# Patient Record
Sex: Male | Born: 1990 | Hispanic: No | Smoking: Former smoker
Health system: Southern US, Community
[De-identification: ages and names within clinical notes are randomized; demographics above are authoritative.]

## PROBLEM LIST (undated history)

## (undated) DIAGNOSIS — D6803 Von willebrand disease, type 3: Secondary | ICD-10-CM

## (undated) DIAGNOSIS — D680 Von Willebrand's disease: Secondary | ICD-10-CM

---

## 2020-08-31 ENCOUNTER — Emergency Department (HOSPITAL_BASED_OUTPATIENT_CLINIC_OR_DEPARTMENT_OTHER)
Admission: EM | Admit: 2020-08-31 | Discharge: 2020-08-31 | Disposition: A | Discharge status: Home / Self Care | Payer: Self-pay | Attending: Emergency Medicine | Admitting: Emergency Medicine

## 2020-08-31 ENCOUNTER — Encounter (HOSPITAL_COMMUNITY): Payer: Self-pay

## 2020-08-31 ENCOUNTER — Emergency Department (HOSPITAL_BASED_OUTPATIENT_CLINIC_OR_DEPARTMENT_OTHER): Payer: Self-pay

## 2020-08-31 ENCOUNTER — Emergency Department (HOSPITAL_COMMUNITY)
Admission: EM | Admit: 2020-08-31 | Discharge: 2020-08-31 | Disposition: A | Discharge status: Home / Self Care | Payer: Self-pay | Attending: Emergency Medicine | Admitting: Emergency Medicine

## 2020-08-31 ENCOUNTER — Encounter (HOSPITAL_BASED_OUTPATIENT_CLINIC_OR_DEPARTMENT_OTHER): Payer: Self-pay | Admitting: Emergency Medicine

## 2020-08-31 ENCOUNTER — Other Ambulatory Visit: Payer: Self-pay

## 2020-08-31 DIAGNOSIS — H9201 Otalgia, right ear: Secondary | ICD-10-CM | POA: Insufficient documentation

## 2020-08-31 DIAGNOSIS — S0921XA Traumatic rupture of right ear drum, initial encounter: Secondary | ICD-10-CM | POA: Insufficient documentation

## 2020-08-31 DIAGNOSIS — X58XXXA Exposure to other specified factors, initial encounter: Secondary | ICD-10-CM | POA: Insufficient documentation

## 2020-08-31 DIAGNOSIS — Z5321 Procedure and treatment not carried out due to patient leaving prior to being seen by health care provider: Secondary | ICD-10-CM | POA: Insufficient documentation

## 2020-08-31 HISTORY — DX: Von Willebrand's disease: D68.0

## 2020-08-31 HISTORY — DX: Von willebrand disease, type 3: D68.03

## 2020-08-31 MED ORDER — HYDROCODONE-ACETAMINOPHEN 5-325 MG PO TABS: 1.0000 | ORAL_TABLET | ORAL | 0 refills | Status: AC | PRN

## 2020-08-31 MED ORDER — AMOXICILLIN 875 MG PO TABS: 875.0000 mg | ORAL_TABLET | Freq: Two times a day (BID) | ORAL | 0 refills | Status: AC

## 2020-08-31 MED ORDER — AMOXICILLIN 500 MG PO CAPS
1000.0000 mg | ORAL_CAPSULE | Freq: Once | ORAL | Status: AC
  Administered 2020-08-31: 1000 mg via ORAL
  Filled 2020-08-31: qty 2

## 2020-08-31 MED ORDER — HYDROCODONE-ACETAMINOPHEN 5-325 MG PO TABS
2.0000 | ORAL_TABLET | Freq: Once | ORAL | Status: AC
  Administered 2020-08-31: 2 via ORAL
  Filled 2020-08-31: qty 2

## 2020-08-31 NOTE — ED Provider Notes (Signed)
[facility] Provider Note: Lowella Dell, MD, FACEP  CSN: [id number] MRN: [id number] ARRIVAL: 08/31/20 at 0424 ROOM: [facility]01/[facility]01   CHIEF COMPLAINT  Ear Pain   HISTORY OF PRESENT ILLNESS  08/31/20 4:35 AM Erik Khan is a 29 y.o. male who stuck a Q-tip into his right ear yesterday.  He did not realize that the cotton swab had fallen off the end.  He had the gradual onset of pain in the right ear which is now severe (8 out of 10) and hearing is decreased in that ear.  He has had no bleeding or drainage from that ear.  Nothing makes the pain better or worse.  He has von Willebrand's disease and is concerned he may have caused bleeding behind the eardrum.   Past Medical History:  Diagnosis Date  . Von Willebrand disease, type III (HCC)     History reviewed. No pertinent surgical history.  No family history on file.  Social History   Tobacco Use  . Smoking status: Never Smoker  . Smokeless tobacco: Never Used  Substance Use Topics  . Alcohol use: Never  . Drug use: Never    Prior to Admission medications   Medication Sig Start Date End Date Taking? Authorizing Provider  amoxicillin (AMOXIL) 875 MG tablet Take 1 tablet (875 mg total) by mouth 2 (two) times daily. 08/31/20   Elgie Maziarz, MD  HYDROcodone-acetaminophen (NORCO) 5-325 MG tablet Take 1 tablet by mouth every 4 (four) hours as needed for severe pain. 08/31/20   Jaiven Graveline, MD    Allergies Aspirin   REVIEW OF SYSTEMS  Negative except as noted here or in the History of Present Illness.   PHYSICAL EXAMINATION  Initial Vital Signs Blood pressure (!) 158/93, pulse (!) 55, temperature 98.5 F (36.9 C), temperature source Oral, resp. rate 16, height 5\' 6"  (1.676 m), weight 86.2 kg, SpO2 100 %.  Examination General: Well-developed, well-nourished male in no acute distress; appearance consistent with age of record HENT: normocephalic; left TM normal; right TM with small puncture and blood behind  drum Eyes: pupils equal, round and reactive to light; extraocular muscles intact Neck: supple Heart: regular rate and rhythm Lungs: clear to auscultation bilaterally Abdomen: soft; nondistended; nontender; bowel sounds present Extremities: No deformity; full range of motion Neurologic: Awake, alert and oriented; motor function intact in all extremities and symmetric; no facial droop Skin: Warm and dry Psychiatric: Normal mood and affect   RESULTS  Summary of this visit's results, reviewed and interpreted by myself:   EKG Interpretation  Date/Time:    Ventricular Rate:    PR Interval:    QRS Duration:   QT Interval:    QTC Calculation:   R Axis:     Text Interpretation:        Laboratory Studies: No results found for this or any previous visit (from the past 24 hour(s)). Imaging Studies: No results found.  ED COURSE and MDM  Nursing notes, initial and subsequent vitals signs, including pulse oximetry, reviewed and interpreted by myself.  Vitals:   08/31/20 0432 08/31/20 0434 08/31/20 0620  BP:  (!) 158/93 126/88  Pulse:  (!) 55 60  Resp:  16 16  Temp:  98.5 F (36.9 C)   TempSrc:  Oral   SpO2:  100% 99%  Weight: 86.2 kg    Height: 5\' 6"  (1.676 m)     Medications  HYDROcodone-acetaminophen (NORCO/VICODIN) 5-325 MG per tablet 2 tablet (2 tablets Oral Given 08/31/20 0508)  amoxicillin (AMOXIL) capsule 1,000 mg (1,000 mg Oral Given 08/31/20 0618)   6:25 AM Although the CT report did not pull into this note it does show opacification in the right mastoid and ear which could represent hemorrhage.  We will place him on an antibiotic for prophylaxis against otitis media as well as analgesic for his pain.  He intends to self administer his von Willebrand factor when he gets home now that we have evidence concerning for bleeding.   PROCEDURES  Procedures   ED DIAGNOSES     ICD-10-CM   1. Traumatic perforation of tympanic membrane, right, initial encounter  S09.21XA          Bonham Zingale, Jonny Ruiz, MD 08/31/20 [phone]

## 2020-08-31 NOTE — ED Triage Notes (Signed)
Pt states that he was using a qtip yesterday and it didn't have the tip on it, now having pain on the R side.  

## 2020-08-31 NOTE — ED Triage Notes (Signed)
Pt states that he was using a qtip yesterday and it didn't have the tip on it, now having pain on the R side.

## 2021-04-12 IMAGING — CT CT TEMPORAL BONES W/O CM
2 of 6 series · 13 of 40 positions shown, 16 images · non-contrast
Comparison: None.

CLINICAL DATA: Right-sided pain after using a Q-tip yesterday

EXAM:
CT TEMPORAL BONES WITHOUT CONTRAST
TECHNIQUE: Axial and coronal plane CT imaging of the petrous temporal bones was
performed with thin-collimation image reconstruction. No intravenous
contrast was administered. Multiplanar CT image reconstructions were
also generated.

[Series 4: coronal bone · coronal · 0.16mm/px · 2 of 179 slices shown]
[im 60/179  bone]
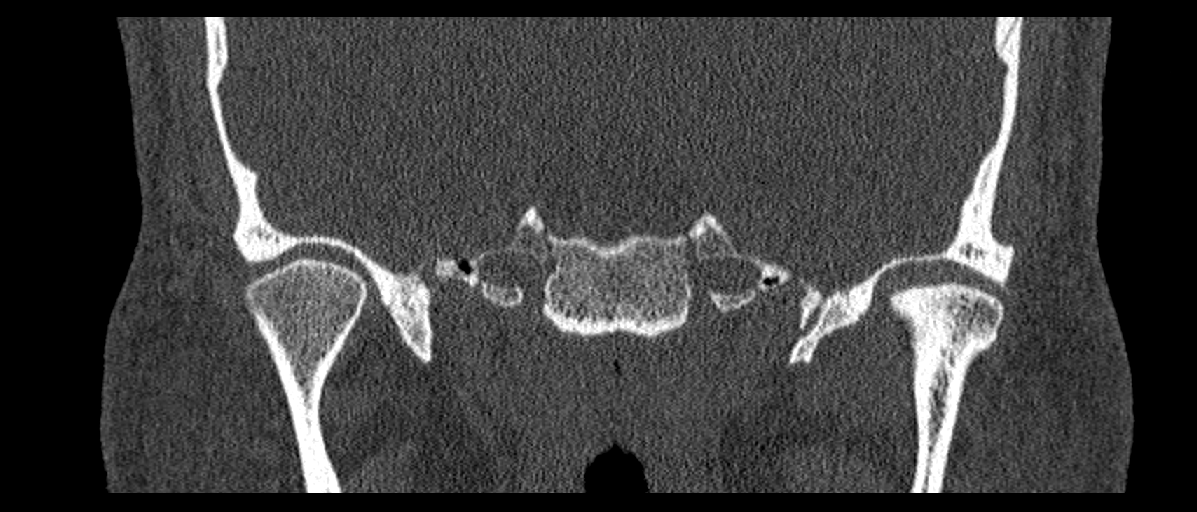
[im 119/179  bone]
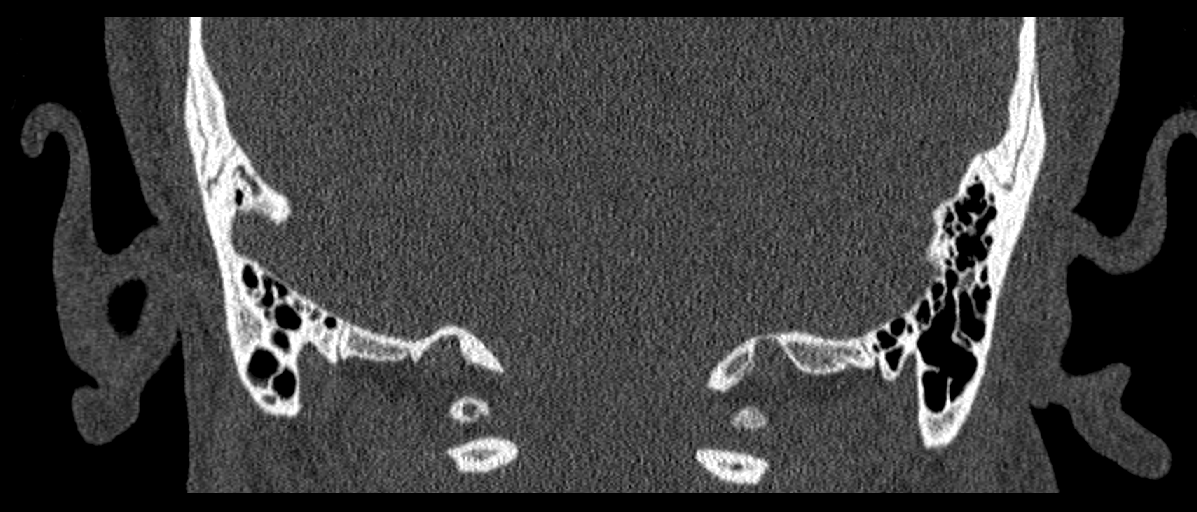

[Series 5: ax mag right · axial · 0.20mm/px · z∈[-107,-52]mm · 11 of 113 slices shown, 14 images]
[im 10/113  brain]
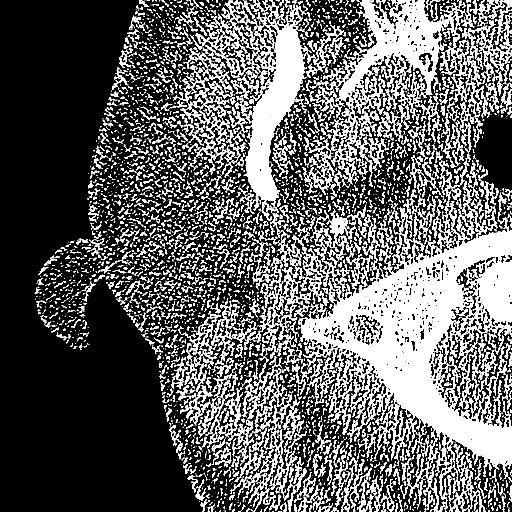
[im 10/113  bone]
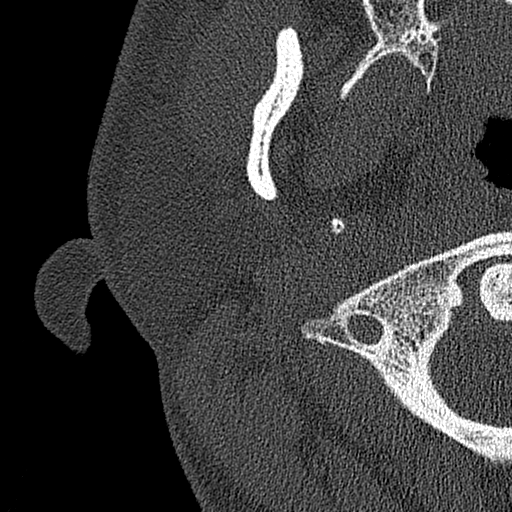
[im 19/113  bone]
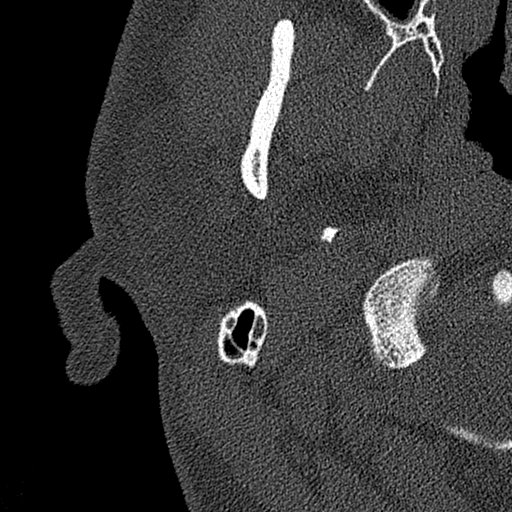
[im 29/113  bone]
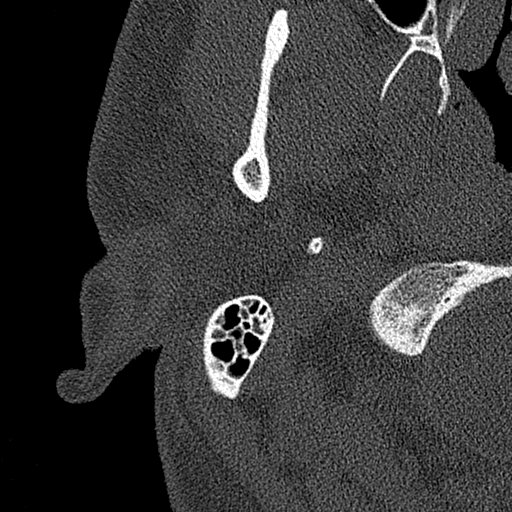
[im 38/113  bone]
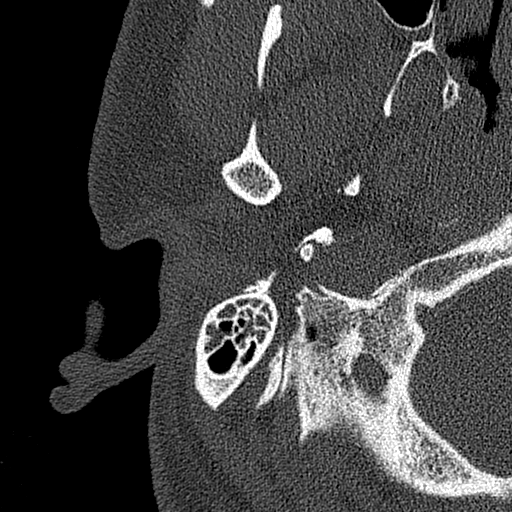
[im 47/113  brain]
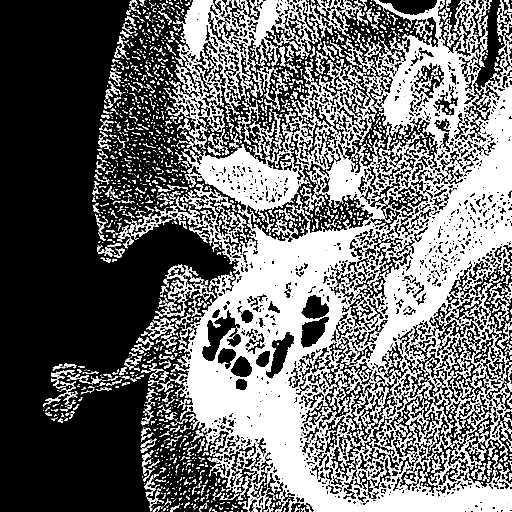
[im 47/113  bone]
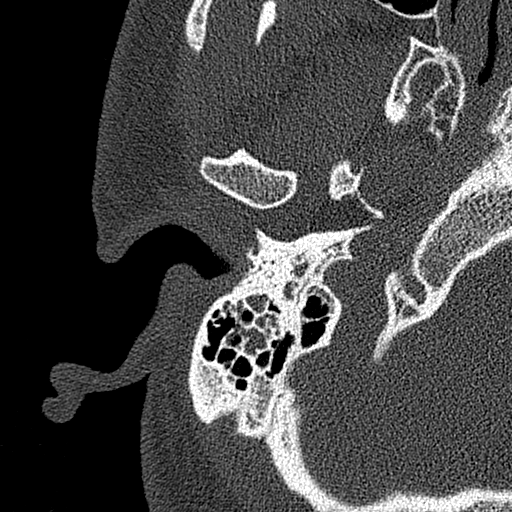
[im 57/113  bone]
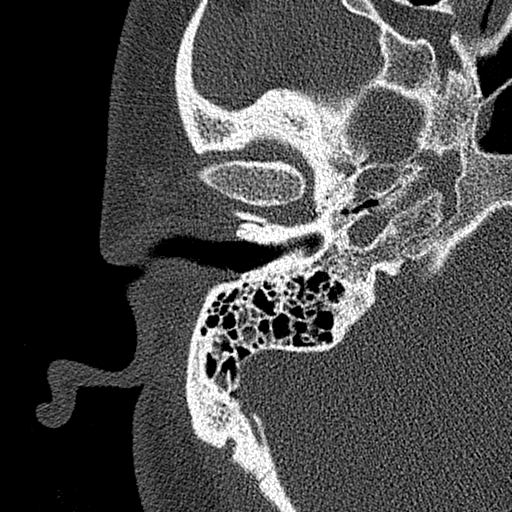
[im 66/113  bone]
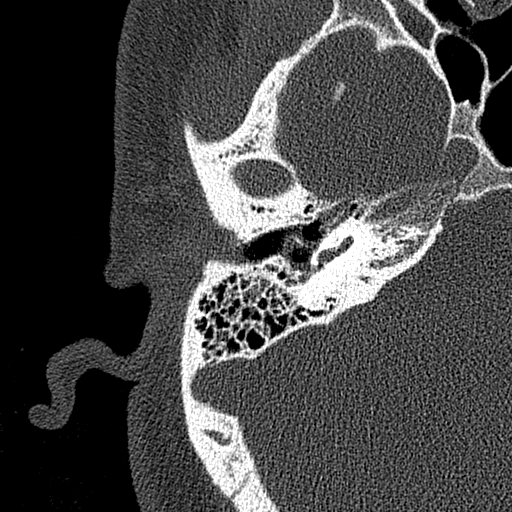
[im 75/113  bone]
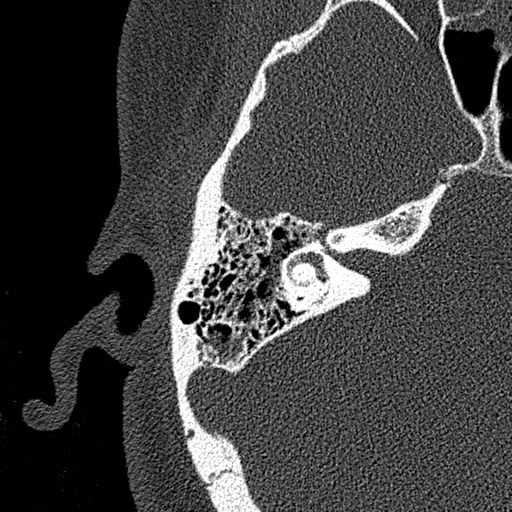
[im 85/113  brain]
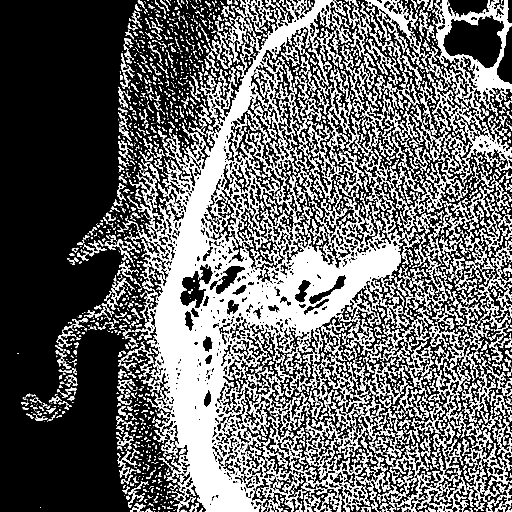
[im 85/113  bone]
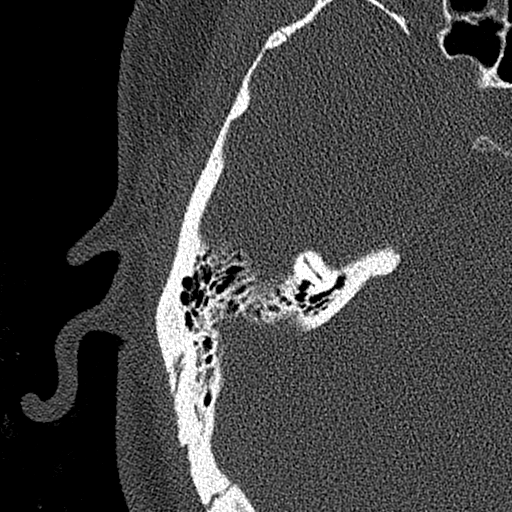
[im 94/113  bone]
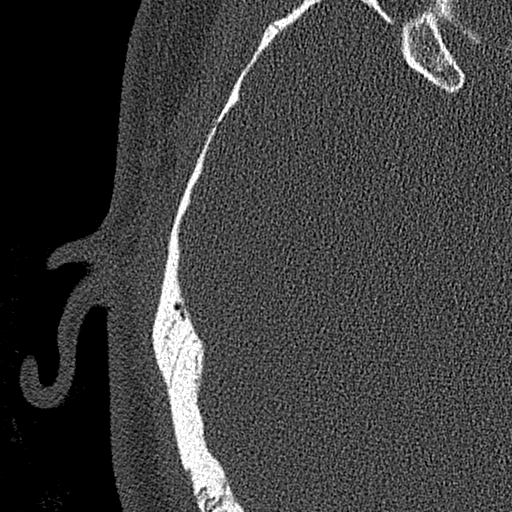
[im 103/113  bone]
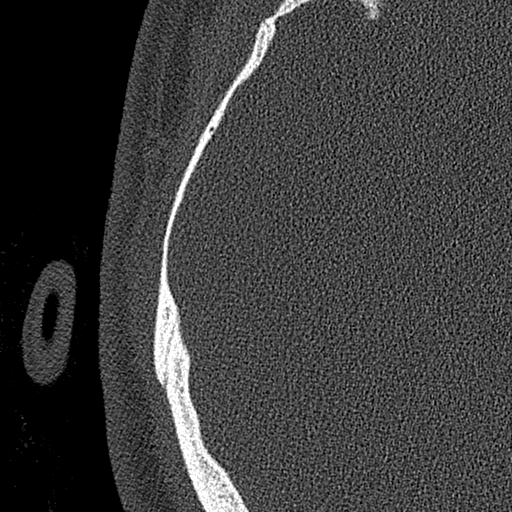

[13 of 40 positions shown; findings below may reference images not displayed]

FINDINGS: Right temporal bone: The [place] and external auditory canal are
unremarkable. The tympanic membrane is either thin or obscured. No
visible tympanic membrane defect. The ossicles are normally formed
and aligned. Mastoid and middle ear is well pneumatized with
moderate patchy opacification of both the middle ear and mastoid air
cells. Given history of Von Willebrand's disease there is clinical
concern for hemorrhage. Given the small size of the opacity
densitometry is not reliable for differentiating hemorrhage,
granulation tissue, and fluid. No bony erosion. No gross foreign
body. The labyrinthine structures appear normally formed and bony
covered. Internal auditory canal is normal in size. The vestibular
aqueduct is normal in size. The carotid and sigmoid sinus are bone
covered. Unremarkable facial nerve canal.

Left temporal bone: The [place] and external auditory canal are
unremarkable. The tympanic membrane is thin. The ossicles are
normally formed and aligned. Mastoid and middle ear is well
pneumatized and aerated. The labyrinthine structures appear normally
formed and bony covered. Internal auditory canal is normal in size.
The vestibular aqueduct is normal in size. The carotid and sigmoid
sinus are bone covered. Unremarkable facial nerve canal.

Symmetric nasopharynx. Moderate to extensive opacification of
bilateral ethmoid air cells.
IMPRESSION: 1. Patchy right mastoid and middle ear opacification which could be
fluid, granulation tissue, or hemorrhage in this setting. No foreign
body or erosion.
2. Ethmoid sinusitis.

## 2024-01-20 ENCOUNTER — Encounter: Payer: Self-pay | Admitting: Psychiatry

## 2024-01-20 ENCOUNTER — Other Ambulatory Visit: Payer: Self-pay

## 2024-01-20 ENCOUNTER — Ambulatory Visit (INDEPENDENT_AMBULATORY_CARE_PROVIDER_SITE_OTHER): Payer: Self-pay | Admitting: Psychiatry

## 2024-01-20 VITALS — BP 131/87 | HR 71 | Temp 97.5°F | Ht 66.0 in | Wt 183.8 lb

## 2024-01-20 DIAGNOSIS — F5105 Insomnia due to other mental disorder: Secondary | ICD-10-CM

## 2024-01-20 DIAGNOSIS — F99 Mental disorder, not otherwise specified: Secondary | ICD-10-CM

## 2024-01-20 DIAGNOSIS — F3181 Bipolar II disorder: Secondary | ICD-10-CM

## 2024-01-20 MED ORDER — LITHIUM CARBONATE 300 MG PO CAPS: 300.0000 mg | ORAL_CAPSULE | Freq: Every day | ORAL | 1 refills | Status: AC
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
## 2024-02-02 ENCOUNTER — Ambulatory Visit: Payer: PRIVATE HEALTH INSURANCE | Admitting: Psychiatry
